# Patient Record
Sex: Male | Born: 1980 | Race: White | Hispanic: No | Marital: Single | Smoking: Former smoker
Health system: Southern US, Community
[De-identification: ages and names within clinical notes are randomized; demographics above are authoritative.]

## PROBLEM LIST (undated history)

## (undated) DIAGNOSIS — I10 Essential (primary) hypertension: Secondary | ICD-10-CM

---

## 2002-01-27 ENCOUNTER — Encounter: Payer: Self-pay | Admitting: Emergency Medicine

## 2002-01-27 ENCOUNTER — Emergency Department (HOSPITAL_COMMUNITY): Admission: EM | Admit: 2002-01-27 | Discharge: 2002-01-27 | Payer: Self-pay | Admitting: Emergency Medicine

## 2008-06-20 ENCOUNTER — Encounter: Admission: RE | Admit: 2008-06-20 | Discharge: 2008-06-20 | Payer: Self-pay | Admitting: Family Medicine

## 2009-04-22 ENCOUNTER — Encounter
Admission: RE | Admit: 2009-04-22 | Discharge: 2009-07-21 | Payer: Self-pay | Admitting: Physical Medicine & Rehabilitation

## 2009-04-28 ENCOUNTER — Ambulatory Visit: Payer: Self-pay | Admitting: Physical Medicine & Rehabilitation

## 2009-05-22 ENCOUNTER — Ambulatory Visit: Payer: Self-pay | Admitting: Physical Medicine & Rehabilitation

## 2009-05-29 ENCOUNTER — Ambulatory Visit: Payer: Self-pay | Admitting: Physical Medicine & Rehabilitation

## 2009-07-02 ENCOUNTER — Ambulatory Visit (HOSPITAL_COMMUNITY)
Admission: RE | Admit: 2009-07-02 | Discharge: 2009-07-02 | Payer: Self-pay | Admitting: Physical Medicine & Rehabilitation

## 2009-07-03 ENCOUNTER — Ambulatory Visit: Payer: Self-pay | Admitting: Physical Medicine & Rehabilitation

## 2009-07-29 ENCOUNTER — Encounter: Admission: RE | Admit: 2009-07-29 | Discharge: 2009-07-29 | Payer: Self-pay | Admitting: Podiatry

## 2010-06-29 ENCOUNTER — Inpatient Hospital Stay (INDEPENDENT_AMBULATORY_CARE_PROVIDER_SITE_OTHER)
Admission: RE | Admit: 2010-06-29 | Discharge: 2010-06-29 | Disposition: A | Discharge status: Home / Self Care | Payer: PRIVATE HEALTH INSURANCE | Source: Ambulatory Visit | Attending: Emergency Medicine | Admitting: Emergency Medicine

## 2010-06-29 DIAGNOSIS — R5381 Other malaise: Secondary | ICD-10-CM

## 2010-06-29 DIAGNOSIS — R5383 Other fatigue: Secondary | ICD-10-CM

## 2010-06-29 DIAGNOSIS — B9789 Other viral agents as the cause of diseases classified elsewhere: Secondary | ICD-10-CM

## 2010-06-29 LAB — CBC
HCT: 48.1 % (ref 39.0–52.0)
Hemoglobin: 17.6 g/dL — ABNORMAL HIGH (ref 13.0–17.0)
MCH: 32.5 pg (ref 26.0–34.0)
MCHC: 36.6 g/dL — ABNORMAL HIGH (ref 30.0–36.0)

## 2010-06-29 LAB — POCT I-STAT, CHEM 8
BUN: 8 mg/dL (ref 6–23)
Chloride: 102 mEq/L (ref 96–112)
Creatinine, Ser: 1 mg/dL (ref 0.4–1.5)
Hemoglobin: 18 g/dL — ABNORMAL HIGH (ref 13.0–17.0)
Potassium: 3.9 mEq/L (ref 3.5–5.1)
Sodium: 140 mEq/L (ref 135–145)
TCO2: 28 mmol/L (ref 0–100)

## 2010-06-29 LAB — DIFFERENTIAL
Basophils Relative: 0 % (ref 0–1)
Monocytes Absolute: 0.7 10*3/uL (ref 0.1–1.0)
Monocytes Relative: 6 % (ref 3–12)
Neutro Abs: 6.8 10*3/uL (ref 1.7–7.7)

## 2010-06-30 LAB — POCT URINALYSIS DIP (DEVICE)
Hgb urine dipstick: NEGATIVE
Protein, ur: NEGATIVE mg/dL
Specific Gravity, Urine: 1.02 (ref 1.005–1.030)
Urobilinogen, UA: 1 mg/dL (ref 0.0–1.0)

## 2010-10-14 ENCOUNTER — Emergency Department (HOSPITAL_COMMUNITY): Payer: Worker's Compensation

## 2010-10-14 ENCOUNTER — Emergency Department (HOSPITAL_COMMUNITY)
Admission: EM | Admit: 2010-10-14 | Discharge: 2010-10-14 | Disposition: A | Discharge status: Home / Self Care | Payer: Worker's Compensation | Attending: Emergency Medicine | Admitting: Emergency Medicine

## 2010-10-14 DIAGNOSIS — R112 Nausea with vomiting, unspecified: Secondary | ICD-10-CM | POA: Insufficient documentation

## 2010-10-14 DIAGNOSIS — I10 Essential (primary) hypertension: Secondary | ICD-10-CM | POA: Insufficient documentation

## 2010-10-14 DIAGNOSIS — R059 Cough, unspecified: Secondary | ICD-10-CM | POA: Insufficient documentation

## 2010-10-14 DIAGNOSIS — T542X1A Toxic effect of corrosive acids and acid-like substances, accidental (unintentional), initial encounter: Secondary | ICD-10-CM | POA: Insufficient documentation

## 2010-10-14 DIAGNOSIS — R0609 Other forms of dyspnea: Secondary | ICD-10-CM | POA: Insufficient documentation

## 2010-10-14 DIAGNOSIS — T594X4A Toxic effect of chlorine gas, undetermined, initial encounter: Secondary | ICD-10-CM | POA: Insufficient documentation

## 2010-10-14 DIAGNOSIS — R0602 Shortness of breath: Secondary | ICD-10-CM | POA: Insufficient documentation

## 2010-10-14 DIAGNOSIS — T5991XA Toxic effect of unspecified gases, fumes and vapors, accidental (unintentional), initial encounter: Secondary | ICD-10-CM | POA: Insufficient documentation

## 2010-10-14 DIAGNOSIS — R0989 Other specified symptoms and signs involving the circulatory and respiratory systems: Secondary | ICD-10-CM | POA: Insufficient documentation

## 2010-10-14 DIAGNOSIS — R05 Cough: Secondary | ICD-10-CM | POA: Insufficient documentation

## 2012-09-08 ENCOUNTER — Other Ambulatory Visit (HOSPITAL_COMMUNITY): Payer: Self-pay | Admitting: Internal Medicine

## 2012-09-08 DIAGNOSIS — M539 Dorsopathy, unspecified: Secondary | ICD-10-CM

## 2012-09-14 ENCOUNTER — Ambulatory Visit (HOSPITAL_COMMUNITY)
Admission: RE | Admit: 2012-09-14 | Discharge: 2012-09-14 | Disposition: A | Discharge status: Home / Self Care | Payer: 59 | Source: Ambulatory Visit | Attending: Internal Medicine | Admitting: Internal Medicine

## 2012-09-14 DIAGNOSIS — M539 Dorsopathy, unspecified: Secondary | ICD-10-CM

## 2012-09-14 DIAGNOSIS — M542 Cervicalgia: Secondary | ICD-10-CM | POA: Insufficient documentation

## 2012-09-14 DIAGNOSIS — R209 Unspecified disturbances of skin sensation: Secondary | ICD-10-CM | POA: Insufficient documentation

## 2012-09-14 DIAGNOSIS — M545 Low back pain, unspecified: Secondary | ICD-10-CM | POA: Insufficient documentation

## 2013-05-21 ENCOUNTER — Other Ambulatory Visit (HOSPITAL_COMMUNITY): Payer: Self-pay | Admitting: Internal Medicine

## 2013-05-21 DIAGNOSIS — K769 Liver disease, unspecified: Secondary | ICD-10-CM

## 2013-05-25 ENCOUNTER — Ambulatory Visit (HOSPITAL_COMMUNITY)
Admission: RE | Admit: 2013-05-25 | Discharge: 2013-05-25 | Disposition: A | Discharge status: Home / Self Care | Payer: 59 | Source: Ambulatory Visit | Attending: Internal Medicine | Admitting: Internal Medicine

## 2013-05-25 DIAGNOSIS — K769 Liver disease, unspecified: Secondary | ICD-10-CM

## 2013-05-25 DIAGNOSIS — K7689 Other specified diseases of liver: Secondary | ICD-10-CM | POA: Insufficient documentation

## 2013-09-06 ENCOUNTER — Other Ambulatory Visit (HOSPITAL_COMMUNITY): Payer: Self-pay | Admitting: Internal Medicine

## 2013-09-06 DIAGNOSIS — R109 Unspecified abdominal pain: Secondary | ICD-10-CM

## 2013-09-10 ENCOUNTER — Ambulatory Visit (HOSPITAL_COMMUNITY)
Admission: RE | Admit: 2013-09-10 | Discharge: 2013-09-10 | Disposition: A | Discharge status: Home / Self Care | Payer: 59 | Source: Ambulatory Visit | Attending: Internal Medicine | Admitting: Internal Medicine

## 2013-09-10 DIAGNOSIS — K7689 Other specified diseases of liver: Secondary | ICD-10-CM | POA: Insufficient documentation

## 2013-09-10 DIAGNOSIS — K5730 Diverticulosis of large intestine without perforation or abscess without bleeding: Secondary | ICD-10-CM | POA: Diagnosis not present

## 2013-09-10 DIAGNOSIS — R109 Unspecified abdominal pain: Secondary | ICD-10-CM | POA: Insufficient documentation

## 2013-09-10 MED ORDER — IOHEXOL 300 MG/ML  SOLN
100.0000 mL | Freq: Once | INTRAMUSCULAR | Status: AC | PRN
  Administered 2013-09-10: 100 mL via INTRAVENOUS

## 2013-09-10 MED ORDER — SODIUM CHLORIDE 0.9 % IJ SOLN
INTRAMUSCULAR | Status: AC
  Filled 2013-09-10: qty 45

## 2014-06-13 ENCOUNTER — Ambulatory Visit (INDEPENDENT_AMBULATORY_CARE_PROVIDER_SITE_OTHER): Payer: 59

## 2014-06-13 ENCOUNTER — Ambulatory Visit (INDEPENDENT_AMBULATORY_CARE_PROVIDER_SITE_OTHER): Payer: 59 | Admitting: Podiatry

## 2014-06-13 ENCOUNTER — Encounter: Payer: Self-pay | Admitting: Podiatry

## 2014-06-13 VITALS — BP 153/69 | HR 89 | Resp 12

## 2014-06-13 DIAGNOSIS — M722 Plantar fascial fibromatosis: Secondary | ICD-10-CM

## 2014-06-13 MED ORDER — TRIAMCINOLONE ACETONIDE 10 MG/ML IJ SUSP
10.0000 mg | Freq: Once | INTRAMUSCULAR | Status: AC
  Administered 2014-06-13: 10 mg

## 2014-06-13 MED ORDER — DICLOFENAC SODIUM 75 MG PO TBEC: 75.0000 mg | DELAYED_RELEASE_TABLET | Freq: Two times a day (BID) | ORAL | Status: DC

## 2014-06-13 NOTE — Patient Instructions (Signed)

## 2014-06-13 NOTE — Progress Notes (Signed)
   Subjective:    Patient ID: Robert BertholdJoshua David Dowell, male    DOB: Jul 14, 1980, 34 y.o.   MRN: [id number]  HPI PT STATED LT BOTTOM OF THE FOOT BEEN HAVING PAIN FOR 5 YEARS. THE FOOT IS GETTING WORSE ESPECIALLY WHEN WALKING. TRIED PAIN MEDICATION AND FOOT BRACE BUT NO HELP.   Review of Systems  HENT: Positive for sinus pressure.        Objective:   Physical Exam        Assessment & Plan:

## 2014-06-14 ENCOUNTER — Ambulatory Visit: Payer: Self-pay | Admitting: Podiatry

## 2014-06-14 NOTE — Progress Notes (Signed)
Subjective:     Patient ID: Robert Thornton, male   DOB: 1980/12/29, 34 y.o.   MRN: [id number]  HPI patient states she's had a long-term history of pain in his left heel but it's gotten worse recently. For 5 years off and on it's been hurting for the last few months and it's been increasingly tender   Review of Systems  All other systems reviewed and are negative.      Objective:   Physical Exam  Constitutional: He is oriented to person, place, and time.  Cardiovascular: Intact distal pulses.   Musculoskeletal: Normal range of motion.  Neurological: He is oriented to person, place, and time.  Skin: Skin is warm.  Nursing note and vitals reviewed.  neurovascular status intact muscle strength adequate with range of motion subtalar midtarsal joint within normal limits. Patient's found to have good digital perfusion is well oriented 3 and upon palpation has quite a bit of discomfort in the left plantar fascia at the insertion of the tendon into the calcaneus     Assessment:     Plantar fasciitis left long-term nature with worsening of condition with foot mechanics as part of the problem due to young age and moderate flatfoot deformity    Plan:     H&P and x-rays performed. Injected the left plantar fascia 3 mg Kenalog 5 mg Xylocaine and applied fascial brace with instructions on usage. Placed on diclofenac and reappoint to reevaluate again in the next 2 weeks may require more aggressive treatment and will require orthotics

## 2014-06-19 ENCOUNTER — Ambulatory Visit (INDEPENDENT_AMBULATORY_CARE_PROVIDER_SITE_OTHER): Payer: 59 | Admitting: Podiatry

## 2014-06-19 ENCOUNTER — Encounter: Payer: Self-pay | Admitting: Podiatry

## 2014-06-19 VITALS — BP 124/91 | HR 80 | Resp 15

## 2014-06-19 DIAGNOSIS — M722 Plantar fascial fibromatosis: Secondary | ICD-10-CM | POA: Diagnosis not present

## 2014-06-19 DIAGNOSIS — M779 Enthesopathy, unspecified: Secondary | ICD-10-CM

## 2014-06-19 MED ORDER — TRIAMCINOLONE ACETONIDE 10 MG/ML IJ SUSP
10.0000 mg | Freq: Once | INTRAMUSCULAR | Status: AC
  Administered 2014-06-19: 10 mg

## 2014-06-19 MED ORDER — HYDROCODONE-ACETAMINOPHEN 10-325 MG PO TABS: 1.0000 | ORAL_TABLET | Freq: Three times a day (TID) | ORAL | Status: DC | PRN

## 2014-06-19 NOTE — Progress Notes (Signed)
Subjective:     Patient ID: Robert Thornton, male   DOB: November 15, 1980, 34 y.o.   MRN: [id number]  HPI patient states my heel continues to kill me and also I'm getting a lot of pain in my ankle probably from walking differently and I'm really getting tired of this as it's been going on for a number of years and has worsened recently patient states that he's tried different treatment options without relief of symptoms   Review of Systems     Objective:   Physical Exam Neurovascular status intact muscle strength is adequate with continued discomfort in the plantar aspect of the left heel at the insertional point of the tendon into the calcaneus with fluid buildup noted around the medial band at the insertion of the tendon into the calcaneus and also quite a bit of discomfort noted in the sinus tarsi left    Assessment:     Chronic discomfort secondary to foot structure and plantar fascial-like symptomatology occurring left over right    Plan:     Reviewed conditions and discussed that ultimately this is can require surgery. At this time we're going to try 1 more inserted care before we consider surgery but he does understand he's given need to have this done eventually and he would like to be able to wait several months due to people being out of work. I injected the plantar fascia 3 Milligan Kenalog in the sinus tarsi 3 mg Kenalog 5 mill grams Xylocaine and instructed on reduced activity and reappoint to recheck in 3 weeks and we did go ahead and scanned for orthotics for this patient at the current time

## 2014-06-20 ENCOUNTER — Ambulatory Visit: Payer: 59 | Admitting: Podiatry

## 2014-07-02 ENCOUNTER — Telehealth: Payer: Self-pay | Admitting: *Deleted

## 2014-07-02 MED ORDER — HYDROCODONE-ACETAMINOPHEN 10-325 MG PO TABS: 1.0000 | ORAL_TABLET | Freq: Three times a day (TID) | ORAL | Status: DC | PRN

## 2014-07-02 NOTE — Telephone Encounter (Addendum)
Pt request refill of Vicodin, was instructed to take at night initially, but asked if could take during the day.  Dr. Charlsie Merles stated if the is no problem while taking the Vicodin during the day he can do so.  Pt states he is able to take the Vicodin during the day without problems and would like a refill.  Dr. Charlsie Merles ordered refill as previously.

## 2014-07-02 NOTE — Telephone Encounter (Signed)
fine

## 2014-07-03 ENCOUNTER — Ambulatory Visit: Payer: 59 | Admitting: *Deleted

## 2014-07-03 DIAGNOSIS — M722 Plantar fascial fibromatosis: Secondary | ICD-10-CM

## 2014-07-03 NOTE — Patient Instructions (Signed)

## 2014-07-03 NOTE — Progress Notes (Signed)
Patient ID: Robert Thornton, male   DOB: [birth year], 34 y.o.   MRN: [id number] PICKING UP INSERTS

## 2014-07-05 ENCOUNTER — Other Ambulatory Visit: Payer: 59

## 2014-07-16 ENCOUNTER — Encounter: Payer: Self-pay | Admitting: Podiatry

## 2014-07-16 ENCOUNTER — Ambulatory Visit (INDEPENDENT_AMBULATORY_CARE_PROVIDER_SITE_OTHER): Payer: 59 | Admitting: Podiatry

## 2014-07-16 VITALS — BP 125/81 | HR 74 | Resp 15

## 2014-07-16 DIAGNOSIS — M722 Plantar fascial fibromatosis: Secondary | ICD-10-CM | POA: Diagnosis not present

## 2014-07-16 MED ORDER — HYDROCODONE-ACETAMINOPHEN 10-325 MG PO TABS
1.0000 | ORAL_TABLET | Freq: Three times a day (TID) | ORAL | Status: DC | PRN
Start: 2014-07-16 — End: 2014-08-09

## 2014-07-16 NOTE — Progress Notes (Signed)
Subjective:     Patient ID: Robert Thornton, male   DOB: [birth year], 34 y.o.   MRN: [id number]  HPI patient states that my heel has been killing me and it did not respond to the medication and I am just wanted to have it fixed and the ulna with can control my pain is with medication   Review of Systems     Objective:   Physical Exam Neurovascular status intact muscle strength adequate with discomfort of an exquisite nature left plantar heel medial band at the insertion of the tendon into the calcaneus    Assessment:     Plantar fasciitis left inflammation and fluid around the medial band    Plan:     This is been present for over 2 years and is failed to respond to numerous conservative cares and at this point I have recommended endoscopic release of the medial band. Patient wants surgery and I allowed injury consent form going over alternative treatments and also complications as listed. He understands no guarantee as to results of this procedure and recovery. Can take around 6 months and at this time he signed consent form after extensive review and is scheduled for outpatient surgery. I dispensed air fracture walker with instructions on usage at this time. Due to the pain continues to experience I did write him for another prescription of Vicodin that he can take up to 2 or 3 a day as needed for significant heel pain

## 2014-07-17 ENCOUNTER — Ambulatory Visit: Payer: 59 | Admitting: Podiatry

## 2014-07-23 ENCOUNTER — Telehealth: Payer: Self-pay | Admitting: *Deleted

## 2014-07-23 NOTE — Telephone Encounter (Addendum)
Pt states he is scheduled for surgery 09/05/2014, the Voltaren and Hydrocodone are not helping.  Dr. Charlsie Merlesegal states have pt come in for possibly an injection.

## 2014-07-24 ENCOUNTER — Telehealth: Payer: Self-pay | Admitting: *Deleted

## 2014-07-24 ENCOUNTER — Ambulatory Visit (INDEPENDENT_AMBULATORY_CARE_PROVIDER_SITE_OTHER): Payer: 59 | Admitting: Podiatry

## 2014-07-24 ENCOUNTER — Encounter: Payer: Self-pay | Admitting: Podiatry

## 2014-07-24 VITALS — BP 140/90 | HR 77 | Resp 15

## 2014-07-24 DIAGNOSIS — M722 Plantar fascial fibromatosis: Secondary | ICD-10-CM | POA: Diagnosis not present

## 2014-07-24 MED ORDER — OXYCODONE-ACETAMINOPHEN 10-325 MG PO TABS
1.0000 | ORAL_TABLET | Freq: Three times a day (TID) | ORAL | Status: DC | PRN
Start: 2014-07-24 — End: 2014-07-30

## 2014-07-24 MED ORDER — OXYCODONE-ACETAMINOPHEN 10-325 MG PO TABS
1.0000 | ORAL_TABLET | ORAL | Status: DC | PRN
Start: 2014-07-24 — End: 2014-07-24

## 2014-07-24 NOTE — Telephone Encounter (Signed)
I called and rescheduled patient's surgery from 09/03/2014 to 08/20/2014 per Dr. Charlsie Merlesegal.

## 2014-07-25 ENCOUNTER — Telehealth: Payer: Self-pay | Admitting: *Deleted

## 2014-07-25 NOTE — Telephone Encounter (Signed)
"  I was there yesterday.  Dr. Charlsie Merlesegal was going to move my surgery up.  I didn't get a clear date of when he was going to move it up."  He moved you up to 08/20/2014.  "When will I hear from the surgery center?"  They will call you a day or 2 before and give you the exact arrival time.  "Okay, that's all I needed to know.  Now I can let my HR know and fill out my papers for disability."

## 2014-07-26 NOTE — Progress Notes (Signed)
Subjective:     Patient ID: Robert Thornton, male   DOB: 08-Jul-1980, 34 y.o.   MRN: [id number]  HPI patient presents stating that his heel is still hurting him quite a bit and the pain medicine does not solve the pain at nighttime   Review of Systems     Objective:   Physical Exam Neurovascular status found to be intact muscle strength was adequate and range of motion within normal limits. I did note there to be continued severe discomfort when I palpated the plantar medial aspect of the left heel with inflammation and fluid buildup. He did not have any diminishment in sharp [place] vibratory and his DTR reflexes were intact bilateral    Assessment:     I discussed with Robert Thornton the type of pain he is having and while a significant portion of this appears to be plantar fascial and orientation I cannot rule out that there may not be issues with his back. Patient states that he has seen orthopedic doctor within the last year who did MRIs and did not note any significant pathology and he has had no burning in his upper leg or indications of sciatica or nerve entrapment-like problems except for this continuous pain in his heel which may or may not have some origin in the back or may be completely related to inflamed plantar fascial    Plan:     I reviewed all this with him and at this time we'll remove his surgery and I explained there is no guarantees that this will solve his problem and that it is possible that there may be an issue with his back that may need to be explored evaluated with possible treatment. He understands this completely and wants to first pursue the plantar heel and see the relief he obtains and decide whether or not any other further examinations evaluations will be necessary. I did write him for Percocet for the short-term and his surgery is moved August 2

## 2014-07-30 ENCOUNTER — Telehealth: Payer: Self-pay | Admitting: *Deleted

## 2014-07-30 MED ORDER — OXYCODONE-ACETAMINOPHEN 10-325 MG PO TABS: 1.0000 | ORAL_TABLET | Freq: Three times a day (TID) | ORAL | Status: DC | PRN

## 2014-07-30 NOTE — Telephone Encounter (Addendum)
Pt states he needs a refill of the Percocet.  Dr. Everlena Cooperegal okayed as previously filled.  I refilled one more time, and also 3 days early.

## 2014-07-30 NOTE — Telephone Encounter (Signed)
I wrote him for percoset last visit. He is not asking for another prescription now is he?

## 2014-08-01 ENCOUNTER — Telehealth: Payer: Self-pay | Admitting: *Deleted

## 2014-08-01 ENCOUNTER — Encounter: Payer: Self-pay | Admitting: *Deleted

## 2014-08-01 NOTE — Telephone Encounter (Signed)
Authorization for 08/20/14 surgery was faxed to surgical center.  Authorization number is [id number].

## 2014-08-01 NOTE — Progress Notes (Signed)
Patient is [facility] 08/20/2014.  I had to get [facility] from [facility].  Authorization number is [id number].  I faxed authorization to Aram [facility]Cynthia at [facility].

## 2014-08-09 ENCOUNTER — Other Ambulatory Visit: Payer: Self-pay

## 2014-08-09 MED ORDER — HYDROCODONE-ACETAMINOPHEN 10-325 MG PO TABS: 1.0000 | ORAL_TABLET | Freq: Three times a day (TID) | ORAL | Status: DC | PRN

## 2014-08-20 ENCOUNTER — Encounter: Payer: Self-pay | Admitting: *Deleted

## 2014-08-20 DIAGNOSIS — M722 Plantar fascial fibromatosis: Secondary | ICD-10-CM | POA: Diagnosis not present

## 2014-08-21 ENCOUNTER — Telehealth: Payer: Self-pay | Admitting: *Deleted

## 2014-08-21 NOTE — Telephone Encounter (Signed)
"  I'm calling to verify that patient had surgery.  What date did he have surgery?"  He had it done on 08/20/2014.  "What is the procedure code or diagnosis code?"  The procedure code is [id number].  "The diagnosis is Plantar Fasciitis?"  Yes, it is Plantar Fasciitis.  "Thank you so much you've been a big help."

## 2014-08-23 ENCOUNTER — Telehealth: Payer: Self-pay | Admitting: *Deleted

## 2014-08-23 NOTE — Telephone Encounter (Signed)
Pt asked if he was to keep the post-op dressing in place, and could he shower with his foot in a plastic bag until seen by Dr. Charlsie Merles.  I told him to leave the dressing in place and cover with large plastic bag to shower.  Pt states he's doing well.

## 2014-08-26 ENCOUNTER — Other Ambulatory Visit: Payer: Self-pay

## 2014-08-27 ENCOUNTER — Telehealth: Payer: Self-pay | Admitting: *Deleted

## 2014-08-27 NOTE — Telephone Encounter (Signed)
He should be coming in for his postop tomorrow. Don't want to refill until I see him

## 2014-08-27 NOTE — Progress Notes (Unsigned)
DOS 08/20/2014 Endoscopic release medial band left heel.

## 2014-08-27 NOTE — Telephone Encounter (Signed)
Pt request refill of Percocet

## 2014-08-29 ENCOUNTER — Encounter: Payer: Self-pay | Admitting: Podiatry

## 2014-08-29 ENCOUNTER — Ambulatory Visit (INDEPENDENT_AMBULATORY_CARE_PROVIDER_SITE_OTHER): Payer: 59 | Admitting: Podiatry

## 2014-08-29 VITALS — BP 121/77 | HR 79 | Resp 18

## 2014-08-29 DIAGNOSIS — M722 Plantar fascial fibromatosis: Secondary | ICD-10-CM | POA: Diagnosis not present

## 2014-08-29 MED ORDER — HYDROCODONE-ACETAMINOPHEN 10-325 MG PO TABS: 1.0000 | ORAL_TABLET | Freq: Three times a day (TID) | ORAL | Status: DC | PRN

## 2014-08-29 NOTE — Progress Notes (Signed)
Subjective:     Patient ID: Robert Thornton, male   DOB: 01-Apr-1980, 34 y.o.   MRN: [id number]  HPI patient presents stating my heel seems to be doing well but I still have quite a bit of discomfort in the evening   Review of Systems     Objective:   Physical Exam Neurovascular status intact muscle strength adequate with patient having well coapted incision sites medial lateral aspect of the heel left with negative Homans sign noted. Minimal discomfort when I pressed plantar    Assessment:     Doing well after endoscopic plantar fasciotomy left medial band    Plan:     Advised on continued immobilization with boot for surgical shoe and I reapplied sterile dressing. I did write him for Vicodin 10/21/2023 to use is oriented to try to wean him off pain medicine and I explained him the importance and also to take oral anti-inflammatory. Reappoint 2 weeks for suture removal or earlier if necessary

## 2014-09-03 ENCOUNTER — Telehealth: Payer: Self-pay | Admitting: *Deleted

## 2014-09-03 NOTE — Telephone Encounter (Signed)
Pt asked what to do for night time pain,and how much to wear the surgical sandal and how long to be up on the foot, and could he swim in a private pool.  I told pt to take Ibuprofen if he tolerated it at night and to Ice for comfort, wear the sandal at all times while up and not to be up on the foot more than 15-20 min/hour, and he could swim in a private pool, but wait until no scabbing before ocean or lake swimming.  Pt states understanding.

## 2014-09-09 ENCOUNTER — Telehealth: Payer: Self-pay | Admitting: *Deleted

## 2014-09-09 MED ORDER — HYDROCODONE-ACETAMINOPHEN 10-325 MG PO TABS: 1.0000 | ORAL_TABLET | Freq: Two times a day (BID) | ORAL | Status: DC

## 2014-09-09 NOTE — Telephone Encounter (Signed)
Pt states he is still having a little pain and would like refill of the Hydrocodone.  Dr. Charlsie Merles states can begin to taper down the pain medication as the pt gets further into the post-op period.  Dr. Charlsie Merles orders Hydrocodone 10/325mg  #20 1 tablet bid.  Pt is informed and will pick the rx up in the [place] office.

## 2014-09-16 ENCOUNTER — Telehealth: Payer: Self-pay | Admitting: *Deleted

## 2014-09-16 NOTE — Telephone Encounter (Addendum)
Pt states he still feel like there's some swelling and he still has some pain, this that normal.  I told pt it would be close to 6-9 months before he felt normal and without swelling to some degree.  I encouraged pt to rest ice, and take OTC Ibuprofen if he tolerated it and I would see if Dr. Charlsie Merles wanted him to take a rx antiinflammatory, and call tomorrow.  Pt states understanding.  Pt states he knew it took time to get an answer to his message about the antiinflammatory medication and he wanted [place] to know his pain medication runs out today and he would like a refill.  Dr. Charlsie Merles ordered Tramadol  #90 begin 1 tablet every day, and increase as tolerated to 3 times a day.  Left a message with instructions concerning the Tramadol, and to pick the prescription up in the [place] office.  Pt states it is okay for his father to pick up the Tramadol rx, call if this is a problem.

## 2014-09-18 MED ORDER — TRAMADOL HCL 50 MG PO TABS: 50.0000 mg | ORAL_TABLET | Freq: Three times a day (TID) | ORAL | Status: DC | PRN

## 2014-09-26 ENCOUNTER — Encounter: Payer: Self-pay | Admitting: Podiatry

## 2014-09-26 ENCOUNTER — Ambulatory Visit (INDEPENDENT_AMBULATORY_CARE_PROVIDER_SITE_OTHER): Payer: 59 | Admitting: Podiatry

## 2014-09-26 VITALS — BP 128/88 | HR 80 | Resp 16

## 2014-09-26 DIAGNOSIS — M722 Plantar fascial fibromatosis: Secondary | ICD-10-CM | POA: Diagnosis not present

## 2014-09-26 NOTE — Progress Notes (Signed)
Subjective:     Patient ID: Robert Thornton, male   DOB: 1980/11/24, 34 y.o.   MRN: [id number]  HPI patient presents stating my heel is doing quite a bit better   Review of Systems     Objective:   Physical Exam Neurovascular status intact muscle strength adequate negative Homans sign noted with significant diminishment of discomfort in the plantar aspect of the left heel at the insertional point tendon into calcaneus with wound edges well coapted    Assessment:     Doing well post endoscopic release medial fascial band left    Plan:     Advised on physical therapy anti-inflammatory's and gradual return soft shoe with hopeful return to work in the next 2 weeks. If symptoms were to get worse let us know and I dispensed anklet today

## 2014-09-30 ENCOUNTER — Ambulatory Visit (HOSPITAL_COMMUNITY)
Admission: RE | Admit: 2014-09-30 | Discharge: 2014-09-30 | Disposition: A | Discharge status: Home / Self Care | Payer: 59 | Source: Ambulatory Visit | Attending: Internal Medicine | Admitting: Internal Medicine

## 2014-09-30 ENCOUNTER — Other Ambulatory Visit (HOSPITAL_COMMUNITY): Payer: Self-pay | Admitting: Internal Medicine

## 2014-09-30 DIAGNOSIS — R600 Localized edema: Secondary | ICD-10-CM

## 2014-09-30 DIAGNOSIS — M79661 Pain in right lower leg: Secondary | ICD-10-CM | POA: Diagnosis present

## 2014-10-07 ENCOUNTER — Telehealth: Payer: Self-pay | Admitting: *Deleted

## 2014-10-07 NOTE — Telephone Encounter (Signed)
Pt presents to pick up copy of release to work. Printed and given to pt.

## 2015-05-08 ENCOUNTER — Emergency Department (HOSPITAL_COMMUNITY)
Admission: EM | Admit: 2015-05-08 | Discharge: 2015-05-08 | Disposition: A | Discharge status: Home / Self Care | Payer: 59 | Attending: Emergency Medicine | Admitting: Emergency Medicine

## 2015-05-08 ENCOUNTER — Emergency Department (HOSPITAL_COMMUNITY): Payer: 59

## 2015-05-08 ENCOUNTER — Encounter (HOSPITAL_COMMUNITY): Payer: Self-pay | Admitting: Emergency Medicine

## 2015-05-08 DIAGNOSIS — R1032 Left lower quadrant pain: Secondary | ICD-10-CM | POA: Diagnosis not present

## 2015-05-08 DIAGNOSIS — K5900 Constipation, unspecified: Secondary | ICD-10-CM | POA: Insufficient documentation

## 2015-05-08 DIAGNOSIS — Z87891 Personal history of nicotine dependence: Secondary | ICD-10-CM | POA: Insufficient documentation

## 2015-05-08 DIAGNOSIS — Z79899 Other long term (current) drug therapy: Secondary | ICD-10-CM | POA: Insufficient documentation

## 2015-05-08 DIAGNOSIS — I10 Essential (primary) hypertension: Secondary | ICD-10-CM | POA: Diagnosis not present

## 2015-05-08 DIAGNOSIS — R109 Unspecified abdominal pain: Secondary | ICD-10-CM

## 2015-05-08 HISTORY — DX: Essential (primary) hypertension: I10

## 2015-05-08 LAB — COMPREHENSIVE METABOLIC PANEL
ALT: 68 U/L — ABNORMAL HIGH (ref 17–63)
ANION GAP: 8 (ref 5–15)
AST: 37 U/L (ref 15–41)
Albumin: 3.8 g/dL (ref 3.5–5.0)
Alkaline Phosphatase: 52 U/L (ref 38–126)
BILIRUBIN TOTAL: 0.9 mg/dL (ref 0.3–1.2)
BUN: 6 mg/dL (ref 6–20)
CO2: 25 mmol/L (ref 22–32)
Calcium: 8.6 mg/dL — ABNORMAL LOW (ref 8.9–10.3)
Chloride: 104 mmol/L (ref 101–111)
Creatinine, Ser: 0.93 mg/dL (ref 0.61–1.24)
Glucose, Bld: 87 mg/dL (ref 65–99)
POTASSIUM: 4.4 mmol/L (ref 3.5–5.1)
Sodium: 137 mmol/L (ref 135–145)
TOTAL PROTEIN: 6.5 g/dL (ref 6.5–8.1)

## 2015-05-08 LAB — CBC
HEMATOCRIT: 47.8 % (ref 39.0–52.0)
HEMOGLOBIN: 16.3 g/dL (ref 13.0–17.0)
MCH: 31.2 pg (ref 26.0–34.0)
MCHC: 34.1 g/dL (ref 30.0–36.0)
MCV: 91.4 fL (ref 78.0–100.0)
Platelets: 197 10*3/uL (ref 150–400)
RBC: 5.23 MIL/uL (ref 4.22–5.81)
RDW: 12.5 % (ref 11.5–15.5)
WBC: 8.6 10*3/uL (ref 4.0–10.5)

## 2015-05-08 LAB — LIPASE, BLOOD: LIPASE: 23 U/L (ref 11–51)

## 2015-05-08 LAB — URINALYSIS, ROUTINE W REFLEX MICROSCOPIC
Bilirubin Urine: NEGATIVE
Glucose, UA: NEGATIVE mg/dL
Hgb urine dipstick: NEGATIVE
KETONES UR: NEGATIVE mg/dL
LEUKOCYTES UA: NEGATIVE
NITRITE: NEGATIVE
PH: 5.5 (ref 5.0–8.0)
PROTEIN: NEGATIVE mg/dL
Specific Gravity, Urine: 1.014 (ref 1.005–1.030)

## 2015-05-08 LAB — I-STAT TROPONIN, ED: Troponin i, poc: 0 ng/mL (ref 0.00–0.08)

## 2015-05-08 MED ORDER — SODIUM CHLORIDE 0.9 % IV SOLN
INTRAVENOUS | Status: DC
  Administered 2015-05-08: 12:00:00 via INTRAVENOUS

## 2015-05-08 MED ORDER — IOPAMIDOL (ISOVUE-300) INJECTION 61%
INTRAVENOUS | Status: AC
  Administered 2015-05-08: 100 mL
  Filled 2015-05-08: qty 100

## 2015-05-08 MED ORDER — HYDROMORPHONE HCL 1 MG/ML IJ SOLN: 1.0000 mg | INTRAMUSCULAR | Status: DC | PRN

## 2015-05-08 MED ORDER — ONDANSETRON HCL 4 MG/2ML IJ SOLN
4.0000 mg | Freq: Once | INTRAMUSCULAR | Status: AC
  Administered 2015-05-08: 4 mg via INTRAVENOUS
  Filled 2015-05-08: qty 2

## 2015-05-08 MED ORDER — SODIUM CHLORIDE 0.9 % IV BOLUS (SEPSIS)
1000.0000 mL | Freq: Once | INTRAVENOUS | Status: AC
  Administered 2015-05-08: 1000 mL via INTRAVENOUS

## 2015-05-08 NOTE — ED Notes (Signed)
Pt is in stable condition upon d/c and ambulates from ED. 

## 2015-05-08 NOTE — ED Notes (Signed)
While NT was trying to draw blood, patient became hot and started sweating.   Patient states was nauseated.   Patient denied dizziness or lightheadedness.  "I am hot, it's hot in here".

## 2015-05-08 NOTE — ED Notes (Signed)
MD at bedside; urinal given.

## 2015-05-08 NOTE — ED Notes (Signed)
Patient transported to X-ray 

## 2015-05-08 NOTE — ED Notes (Signed)
Patient states abdominal pain, constipation x 3 weeks.   Patient states has had some back pain also.   Patient states he has been trying to medicate at home with prune juice, formula 1, enemas, stool softeners and no relief.

## 2015-05-08 NOTE — Discharge Instructions (Signed)

## 2015-05-08 NOTE — ED Provider Notes (Signed)
CSN: [id number]     Arrival date & time 05/08/15  0940 History   First MD Initiated Contact with Patient 05/08/15 1012     Chief Complaint  Patient presents with  . Abdominal Pain  . Constipation   Patient is a 35 y.o. male presenting with abdominal pain and constipation. The history is provided by the patient.  Abdominal Pain Pain location:  LLQ Pain quality: bloating and sharp   Pain radiation: lower back. Pain severity:  Severe Onset quality:  Gradual Duration:  4 weeks Timing:  Constant Progression:  Worsening Associated symptoms: constipation   Associated symptoms: no anorexia, no diarrhea, no fever and no vomiting  Cough: he has not had normal bowel movements normally although he is passing stools and he had some stool with enemas.   Constipation Associated symptoms: abdominal pain   Associated symptoms: no anorexia, no diarrhea, no fever and no vomiting    He has not seen anyone since it started.  While here in the ED he began to feel hot and lightheaded as if he was going to faint when his blood was drawn.  That is improving now. Past Medical History  Diagnosis Date  . Hypertension    History reviewed. No pertinent past surgical history. No family history on file. Social History  Substance Use Topics  . Smoking status: Former Games developer  . Smokeless tobacco: None  . Alcohol Use: No    Review of Systems  Constitutional: Negative for fever.  Respiratory: Cough: he has not had normal bowel movements normally although he is passing stools and he had some stool with enemas.   Gastrointestinal: Positive for abdominal pain and constipation. Negative for vomiting, diarrhea and anorexia.  All other systems reviewed and are negative.     Allergies  Sulfa antibiotics and Tramadol  Home Medications   Prior to Admission medications   Medication Sig Start Date End Date Taking? Authorizing Provider  ibuprofen (ADVIL,MOTRIN) 200 MG tablet Take 600 mg by mouth every 6 (six)  hours as needed for mild pain.    Yes Historical Provider, MD  ZUBSOLV 5.7-1.4 MG SUBL Take 1 tablet by mouth 2 (two) times daily. 04/21/15  Yes Historical Provider, MD   BP 113/80 mmHg  Pulse 76  Temp(Src) 97.9 F (36.6 C) (Oral)  Resp 17  Ht  (1.88 m)  Wt 104.327 kg  BMI 29.52 kg/m2  SpO2 95% Physical Exam  Constitutional: No distress.  HENT:  Head: Normocephalic and atraumatic.  Right Ear: External ear normal.  Left Ear: External ear normal.  Eyes: Conjunctivae are normal. Right eye exhibits no discharge. Left eye exhibits no discharge. No scleral icterus.  Neck: Neck supple. No tracheal deviation present.  Cardiovascular: Normal rate, regular rhythm and intact distal pulses.   Pulmonary/Chest: Effort normal and breath sounds normal. No stridor. No respiratory distress. He has no wheezes. He has no rales.  Abdominal: Soft. Bowel sounds are normal. He exhibits no distension and no mass. There is tenderness in the left lower quadrant. There is no rigidity, no rebound and no guarding. No hernia.  Genitourinary:  No rectal mass  Musculoskeletal: He exhibits no edema or tenderness.  Neurological: He is alert. He has normal strength. No cranial nerve deficit (no facial droop, extraocular movements intact, no slurred speech) or sensory deficit. He exhibits normal muscle tone. He displays no seizure activity. Coordination normal.  Skin: Skin is warm and dry. No rash noted. He is not diaphoretic.  Psychiatric: He has a normal  mood and affect.  Nursing note and vitals reviewed.   ED Course  Procedures (including critical care time) Labs Review Labs Reviewed  COMPREHENSIVE METABOLIC PANEL - Abnormal; Notable for the following:    Calcium 8.6 (*)    ALT 68 (*)    All other components within normal limits  LIPASE, BLOOD  CBC  URINALYSIS, ROUTINE W REFLEX MICROSCOPIC (NOT AT [facility])  I-STAT TROPOININ, ED    Imaging Review Ct Abdomen Pelvis W Contrast  05/08/2015  CLINICAL DATA:   Abdominal pain with change in bowel habits for 3 weeks. Constipation. EXAM: CT ABDOMEN AND PELVIS WITH CONTRAST TECHNIQUE: Multidetector CT imaging of the abdomen and pelvis was performed using the standard protocol following bolus administration of intravenous contrast. CONTRAST:  100cc ISOVUE-300 IOPAMIDOL (ISOVUE-300) INJECTION 61% COMPARISON:  09/10/2013 FINDINGS: Lower chest and abdominal wall: 4 mm nodule in the left lower lobe, considered incidental based on size and patient age. Hepatobiliary: Hepatic steatosis without focal lesion.No evidence of biliary obstruction or stone. Pancreas: Unremarkable. Spleen: Unremarkable. Adrenals/Urinary Tract: Negative adrenals. No hydronephrosis or stone. Unremarkable bladder. Reproductive:No pathologic findings. Stomach/Bowel:  No obstruction. No appendicitis. Vascular/Lymphatic: No acute vascular abnormality. Incidental low accessory right renal artery. No mass or adenopathy. Peritoneal: No ascites or pneumoperitoneum. Musculoskeletal: No acute abnormalities. IMPRESSION: 1. No acute finding or explanation for symptoms. No abnormal stool retention to correlate with constipation history. 2. Hepatic steatosis. Electronically Signed   By: Marnee SpringJonathon  Watts M.D.   On: 05/08/2015 13:51   Dg Abd Acute W/chest  05/08/2015  CLINICAL DATA:  Abdominal pain and constipation for 3 weeks, has been taking stool softeners and enemas EXAM: DG ABDOMEN ACUTE W/ 1V CHEST COMPARISON:  None. FINDINGS: Heart size and vascular pattern normal.  Lungs clear.  No free air. Air-fluid levels throughout the colon with some evidence of colon wall thickening. No abnormally dilated loops of bowel. No significant fecal retention. No abnormal opacities. IMPRESSION: Air-fluid levels throughout the large bowel likely the result of self medication for constipation. Currently no significant fecal retention. Suggestion of colon wall thickening. This could indicate colitis. Alternatively this may be the result  of under distention of the large bowel. Correlate clinically and if indicated consider CT scan. Electronically Signed   By: Esperanza Heiraymond  Rubner M.D.   On: 05/08/2015 10:56   I have personally reviewed and evaluated these images and lab results as part of my medical decision-making.   EKG Interpretation  Date/Time:  Thursday May 08 2015 10:10:39 EDT Ventricular Rate:  78 PR Interval:  133 QRS Duration: 94 QT Interval:  339 QTC Calculation: 386 R Axis:   91 Text Interpretation:  Normal sinus rhythm Probable left atrial enlargement Borderline right axis deviation ST elevation suggests acute pericarditis vs early repolarization No previous tracing Reconfirmed by Nochum Fenter  MD-J, Krysta Bloomfield ([id number]) on 05/08/2015 2:32:54 PM        MDM   Final diagnoses:  Abdominal pain, unspecified abdominal location    The patient presented to the emergency room with abdominal pain. While getting his blood drawn he had a vasovagal episode.  EKG is reassuring. Cardiac enzymes are normal.  Laboratory tests are otherwise unremarkable.  Because of the abnormal plain film findings a CT scan was done for further evaluation. No evidence of obstruction or significant constipation. The patient's symptoms could be related to his chronic opiate use.  At this time there does not appear to be any evidence of an acute emergency medical condition and the patient appears stable for discharge  with appropriate outpatient follow up.    Linwood Dibbles, MD 05/08/15 [phone]

## 2016-01-21 DIAGNOSIS — J0100 Acute maxillary sinusitis, unspecified: Secondary | ICD-10-CM | POA: Diagnosis not present

## 2016-01-21 DIAGNOSIS — G4733 Obstructive sleep apnea (adult) (pediatric): Secondary | ICD-10-CM | POA: Diagnosis not present

## 2016-01-21 DIAGNOSIS — J069 Acute upper respiratory infection, unspecified: Secondary | ICD-10-CM | POA: Diagnosis not present

## 2016-02-12 DIAGNOSIS — G4733 Obstructive sleep apnea (adult) (pediatric): Secondary | ICD-10-CM | POA: Diagnosis not present

## 2016-02-13 DIAGNOSIS — R070 Pain in throat: Secondary | ICD-10-CM | POA: Diagnosis not present

## 2016-02-19 ENCOUNTER — Ambulatory Visit (INDEPENDENT_AMBULATORY_CARE_PROVIDER_SITE_OTHER): Payer: 59 | Admitting: Otolaryngology

## 2016-02-19 DIAGNOSIS — J351 Hypertrophy of tonsils: Secondary | ICD-10-CM

## 2016-02-19 DIAGNOSIS — J3501 Chronic tonsillitis: Secondary | ICD-10-CM

## 2016-02-19 DIAGNOSIS — R070 Pain in throat: Secondary | ICD-10-CM | POA: Diagnosis not present

## 2016-02-21 DIAGNOSIS — G4733 Obstructive sleep apnea (adult) (pediatric): Secondary | ICD-10-CM | POA: Diagnosis not present

## 2016-03-11 ENCOUNTER — Ambulatory Visit (INDEPENDENT_AMBULATORY_CARE_PROVIDER_SITE_OTHER): Payer: Self-pay | Admitting: Otolaryngology

## 2016-04-27 DIAGNOSIS — J3489 Other specified disorders of nose and nasal sinuses: Secondary | ICD-10-CM | POA: Diagnosis not present

## 2016-04-27 DIAGNOSIS — R51 Headache: Secondary | ICD-10-CM | POA: Diagnosis not present

## 2016-04-27 DIAGNOSIS — B349 Viral infection, unspecified: Secondary | ICD-10-CM | POA: Diagnosis not present

## 2016-04-27 DIAGNOSIS — J019 Acute sinusitis, unspecified: Secondary | ICD-10-CM | POA: Diagnosis not present

## 2016-04-27 DIAGNOSIS — Z1389 Encounter for screening for other disorder: Secondary | ICD-10-CM | POA: Diagnosis not present

## 2016-05-02 IMAGING — CT CT ABD-PELV W/ CM
2 of 4 series · 10 of 46 positions shown, 11 images · IV contrast (Iodine)
Comparison: 09/10/2013

CLINICAL DATA: Abdominal pain with change in bowel habits for 3
weeks. Constipation.

EXAM:
CT ABDOMEN AND PELVIS WITH CONTRAST
TECHNIQUE: Multidetector CT imaging of the abdomen and pelvis was performed
using the standard protocol following bolus administration of
intravenous contrast.
CONTRAST:  533cc BZMJYR-4AA IOPAMIDOL (BZMJYR-4AA) INJECTION 61%

[Series 201: routine, idose (2) · axial · 0.81mm/px · z∈[+59,+509]mm · 7 of 108 slices shown, 8 images]
[im 9/108  soft-tissue]
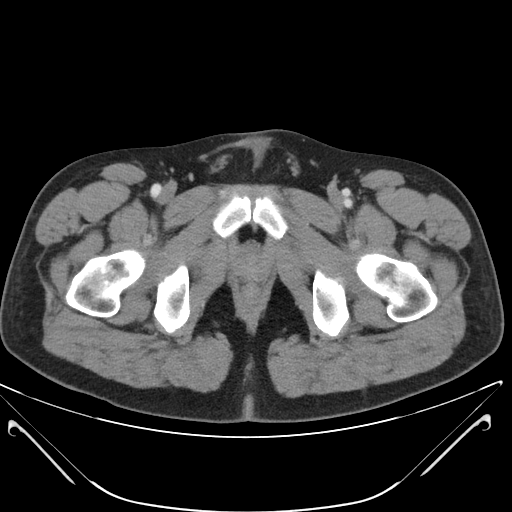
[im 9/108  bone]
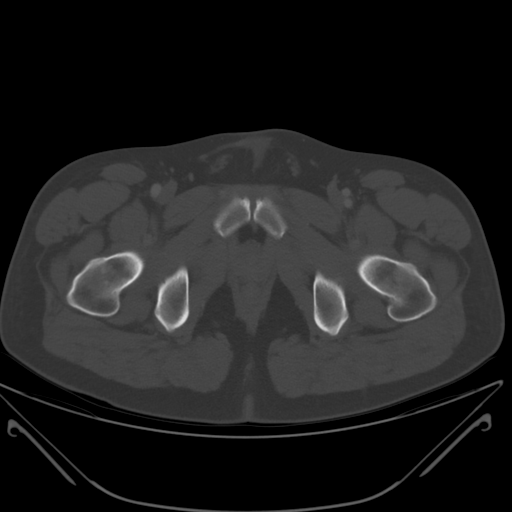
[im 23/108  soft-tissue]
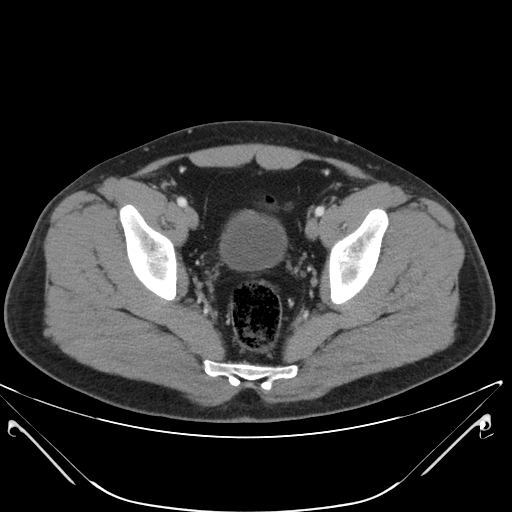
[im 41/108  soft-tissue]
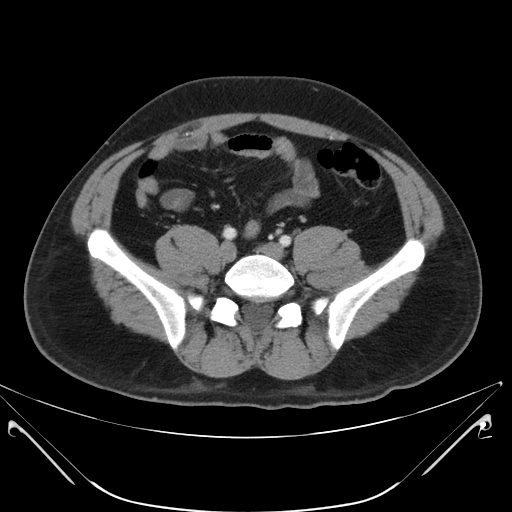
[im 54/108  soft-tissue]
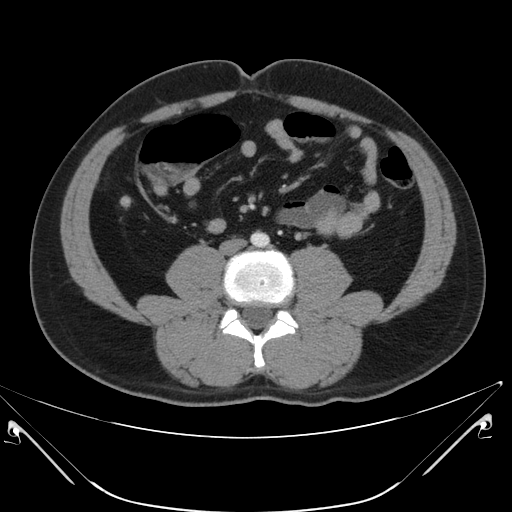
[im 67/108  soft-tissue]
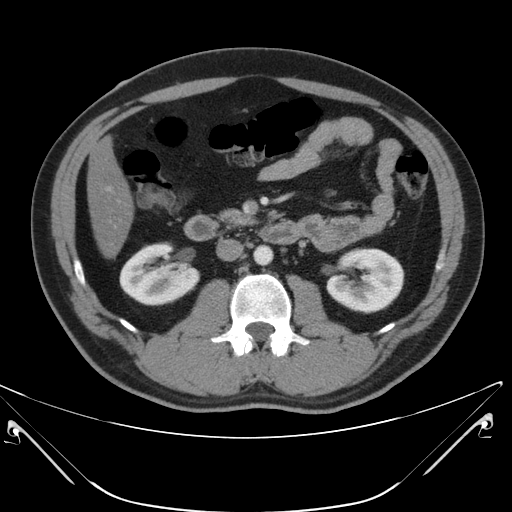
[im 85/108  soft-tissue]
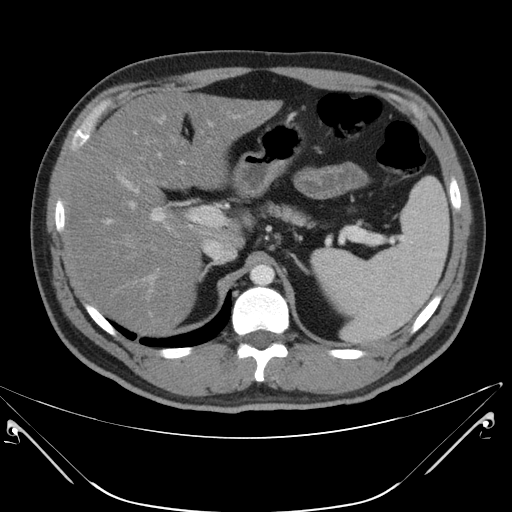
[im 99/108  soft-tissue]
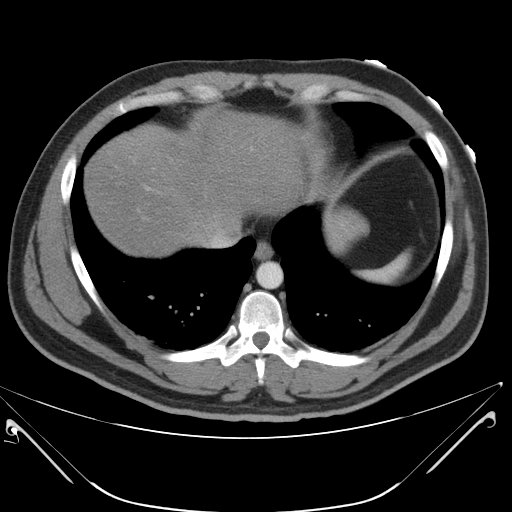

[Series 203: coronals, idose (2) · coronal · 0.45mm/px · 3 of 121 slices shown]
[im 41/121  soft-tissue]
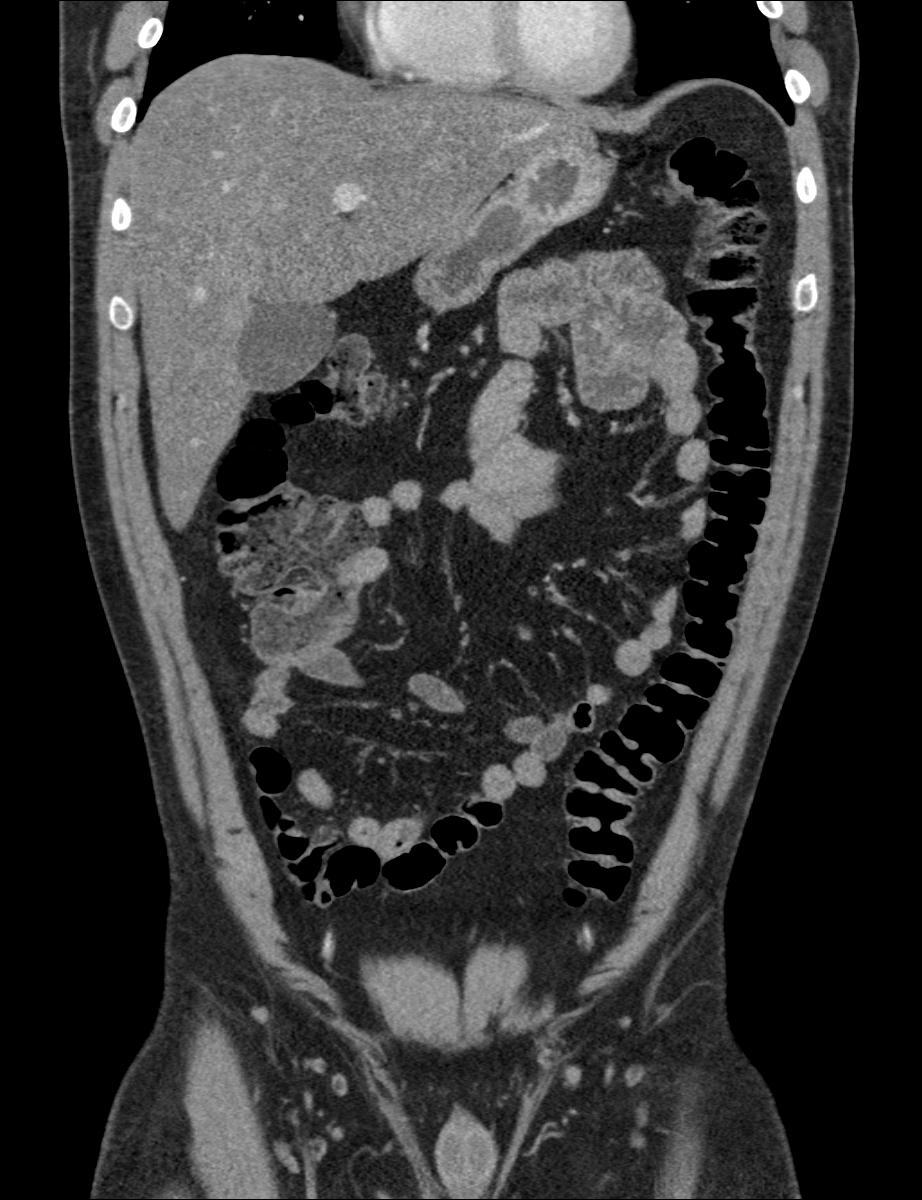
[im 54/121  soft-tissue]
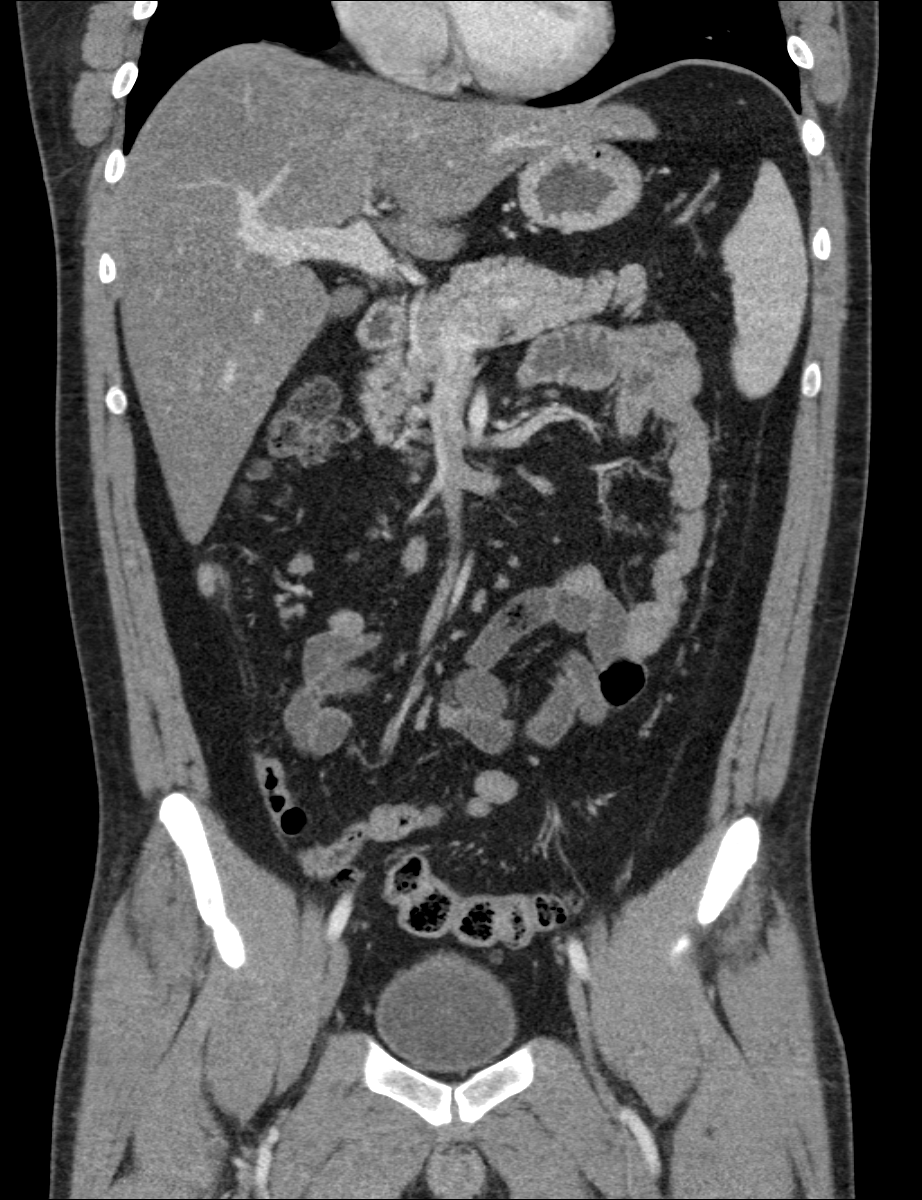
[im 67/121  soft-tissue]
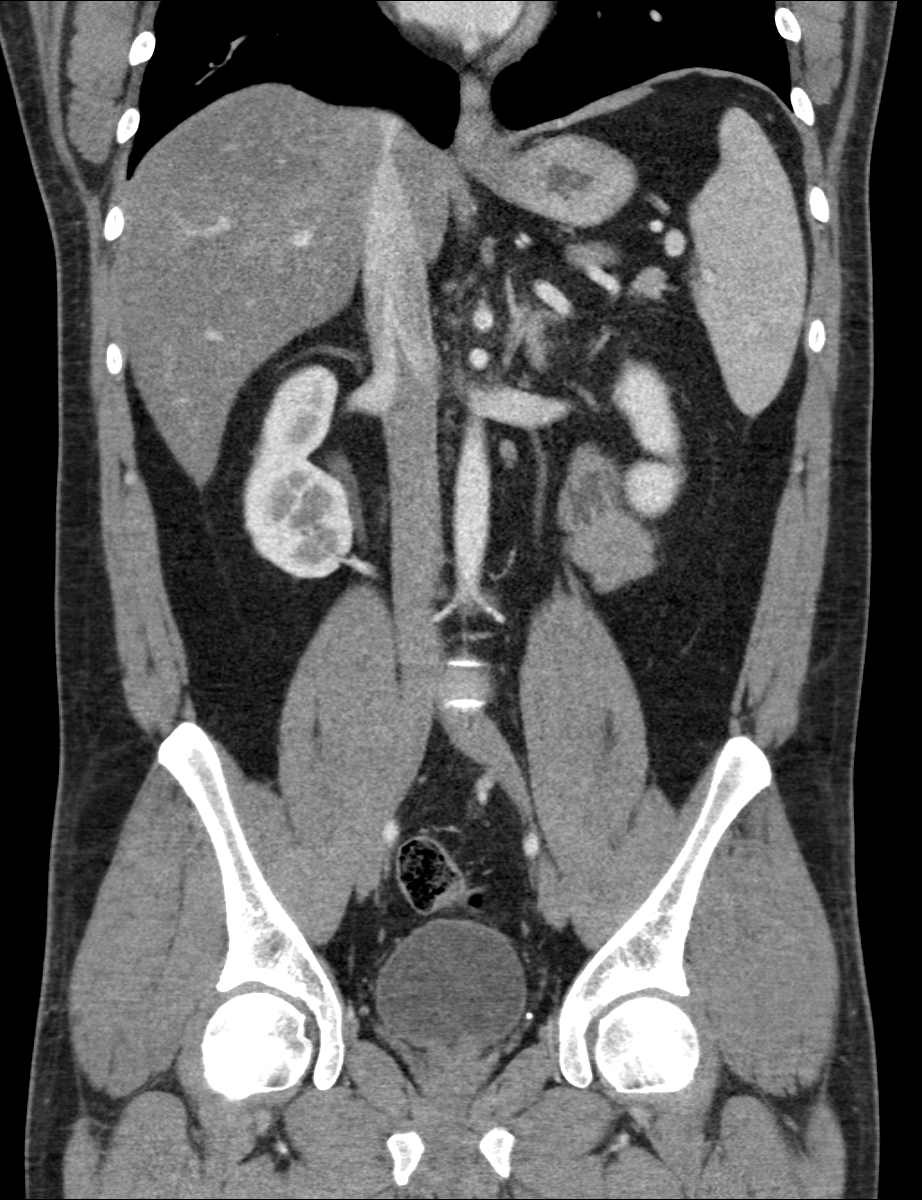

[10 of 46 positions shown; findings below may reference images not displayed]

FINDINGS: Lower chest and abdominal wall: 4 mm nodule in the left lower lobe,
considered incidental based on size and patient age.

Hepatobiliary: Hepatic steatosis without focal lesion.No evidence of
biliary obstruction or stone.

Pancreas: Unremarkable.

Spleen: Unremarkable.

Adrenals/Urinary Tract: Negative adrenals. No hydronephrosis or
stone. Unremarkable bladder.

Reproductive:No pathologic findings.

Stomach/Bowel:  No obstruction. No appendicitis.

Vascular/Lymphatic: No acute vascular abnormality. Incidental low
accessory right renal artery.

No mass or adenopathy.

Peritoneal: No ascites or pneumoperitoneum.

Musculoskeletal: No acute abnormalities.
IMPRESSION: 1. No acute finding or explanation for symptoms. No abnormal stool
retention to correlate with constipation history.
2. Hepatic steatosis.

## 2016-09-27 DIAGNOSIS — R1012 Left upper quadrant pain: Secondary | ICD-10-CM | POA: Diagnosis not present

## 2016-09-27 DIAGNOSIS — M545 Low back pain: Secondary | ICD-10-CM | POA: Diagnosis not present

## 2016-09-27 DIAGNOSIS — R109 Unspecified abdominal pain: Secondary | ICD-10-CM | POA: Diagnosis not present

## 2016-09-27 DIAGNOSIS — K5900 Constipation, unspecified: Secondary | ICD-10-CM | POA: Diagnosis not present

## 2017-04-04 DIAGNOSIS — Z0000 Encounter for general adult medical examination without abnormal findings: Secondary | ICD-10-CM | POA: Diagnosis not present

## 2017-04-04 DIAGNOSIS — M2550 Pain in unspecified joint: Secondary | ICD-10-CM | POA: Diagnosis not present

## 2017-04-04 DIAGNOSIS — Z1389 Encounter for screening for other disorder: Secondary | ICD-10-CM | POA: Diagnosis not present

## 2017-04-04 DIAGNOSIS — L700 Acne vulgaris: Secondary | ICD-10-CM | POA: Diagnosis not present

## 2017-04-04 DIAGNOSIS — Z0001 Encounter for general adult medical examination with abnormal findings: Secondary | ICD-10-CM | POA: Diagnosis not present

## 2017-08-24 DIAGNOSIS — G4730 Sleep apnea, unspecified: Secondary | ICD-10-CM | POA: Diagnosis not present

## 2017-09-28 DIAGNOSIS — G4730 Sleep apnea, unspecified: Secondary | ICD-10-CM | POA: Diagnosis not present

## 2017-10-21 DIAGNOSIS — J301 Allergic rhinitis due to pollen: Secondary | ICD-10-CM | POA: Diagnosis not present

## 2017-10-21 DIAGNOSIS — Z1389 Encounter for screening for other disorder: Secondary | ICD-10-CM | POA: Diagnosis not present

## 2017-11-17 DIAGNOSIS — G4730 Sleep apnea, unspecified: Secondary | ICD-10-CM | POA: Diagnosis not present

## 2017-11-24 DIAGNOSIS — D751 Secondary polycythemia: Secondary | ICD-10-CM | POA: Diagnosis not present

## 2017-11-24 DIAGNOSIS — G609 Hereditary and idiopathic neuropathy, unspecified: Secondary | ICD-10-CM | POA: Diagnosis not present

## 2017-11-24 DIAGNOSIS — Z1389 Encounter for screening for other disorder: Secondary | ICD-10-CM | POA: Diagnosis not present

## 2017-11-24 DIAGNOSIS — R631 Polydipsia: Secondary | ICD-10-CM | POA: Diagnosis not present

## 2017-12-12 DIAGNOSIS — H6692 Otitis media, unspecified, left ear: Secondary | ICD-10-CM | POA: Diagnosis not present

## 2017-12-12 DIAGNOSIS — J069 Acute upper respiratory infection, unspecified: Secondary | ICD-10-CM | POA: Diagnosis not present

## 2017-12-12 DIAGNOSIS — J343 Hypertrophy of nasal turbinates: Secondary | ICD-10-CM | POA: Diagnosis not present

## 2017-12-12 DIAGNOSIS — J029 Acute pharyngitis, unspecified: Secondary | ICD-10-CM | POA: Diagnosis not present

## 2018-02-06 DIAGNOSIS — E6609 Other obesity due to excess calories: Secondary | ICD-10-CM | POA: Diagnosis not present

## 2018-02-06 DIAGNOSIS — R6889 Other general symptoms and signs: Secondary | ICD-10-CM | POA: Diagnosis not present

## 2018-02-06 DIAGNOSIS — B349 Viral infection, unspecified: Secondary | ICD-10-CM | POA: Diagnosis not present

## 2018-02-06 DIAGNOSIS — J111 Influenza due to unidentified influenza virus with other respiratory manifestations: Secondary | ICD-10-CM | POA: Diagnosis not present

## 2018-03-28 DIAGNOSIS — B355 Tinea imbricata: Secondary | ICD-10-CM | POA: Diagnosis not present

## 2018-03-28 DIAGNOSIS — Z6829 Body mass index (BMI) 29.0-29.9, adult: Secondary | ICD-10-CM | POA: Diagnosis not present

## 2018-03-28 DIAGNOSIS — E663 Overweight: Secondary | ICD-10-CM | POA: Diagnosis not present

## 2018-04-21 DIAGNOSIS — R300 Dysuria: Secondary | ICD-10-CM | POA: Diagnosis not present

## 2018-04-21 DIAGNOSIS — K5900 Constipation, unspecified: Secondary | ICD-10-CM | POA: Diagnosis not present

## 2018-04-21 DIAGNOSIS — N411 Chronic prostatitis: Secondary | ICD-10-CM | POA: Diagnosis not present

## 2018-04-21 DIAGNOSIS — K600 Acute anal fissure: Secondary | ICD-10-CM | POA: Diagnosis not present

## 2020-07-25 ENCOUNTER — Other Ambulatory Visit (HOSPITAL_COMMUNITY): Payer: Self-pay | Admitting: Family Medicine

## 2020-07-25 ENCOUNTER — Other Ambulatory Visit: Payer: Self-pay

## 2020-07-25 ENCOUNTER — Ambulatory Visit (HOSPITAL_COMMUNITY)
Admission: RE | Admit: 2020-07-25 | Discharge: 2020-07-25 | Disposition: A | Discharge status: Home / Self Care | Payer: 59 | Source: Ambulatory Visit | Attending: Family Medicine | Admitting: Family Medicine

## 2020-07-25 DIAGNOSIS — M79671 Pain in right foot: Secondary | ICD-10-CM

## 2021-07-20 IMAGING — DX DG FOOT COMPLETE 3+V*R*
3 series · 3 of 3 positions shown · non-contrast
Comparison: None.

CLINICAL DATA: Right foot pain, swelling 2 weeks

EXAM:
RIGHT FOOT COMPLETE - 3+ VIEW

[foot ap]
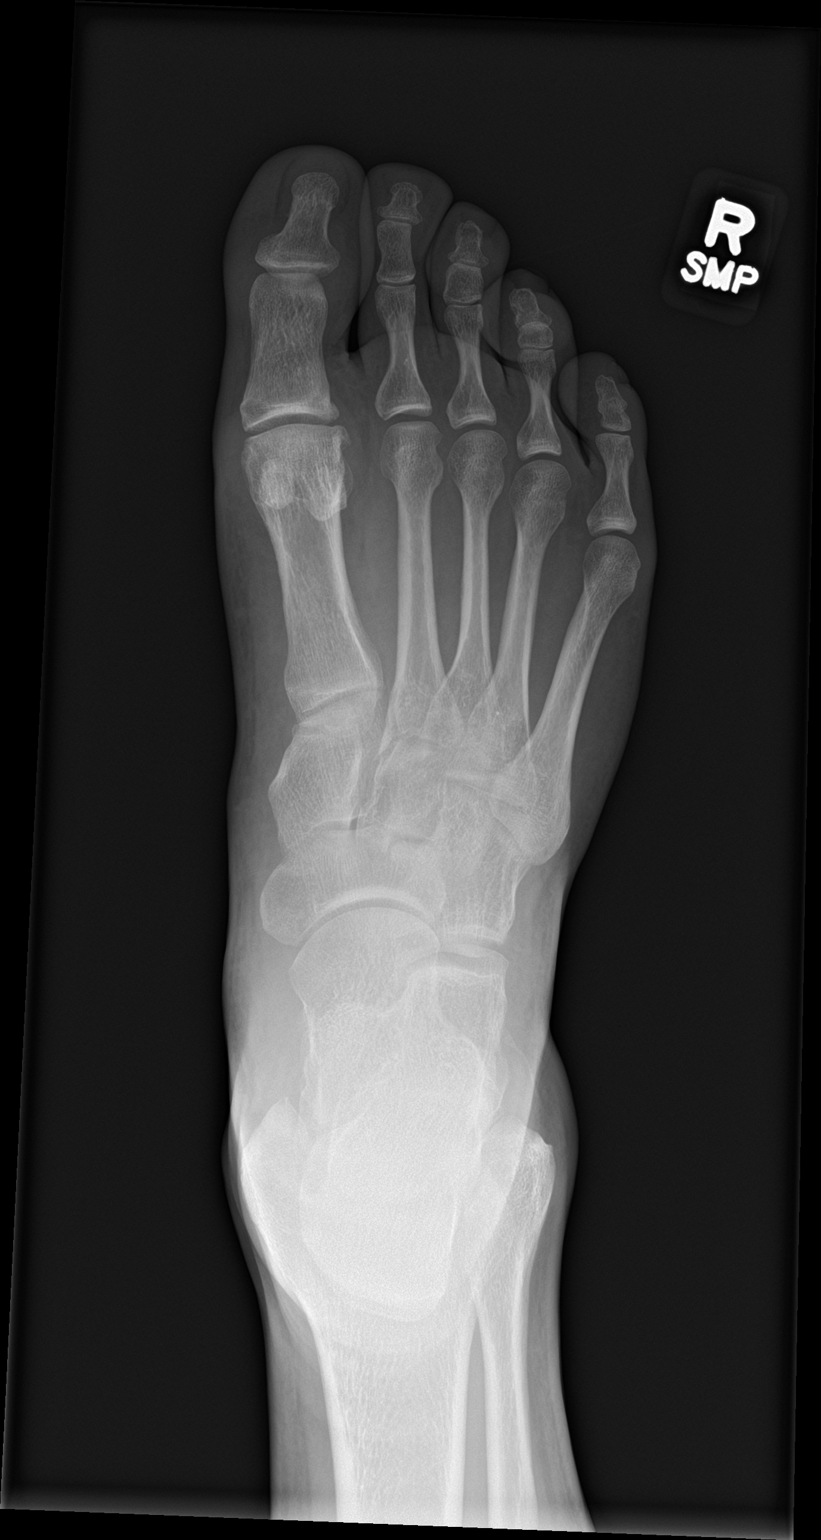

[foot obl]
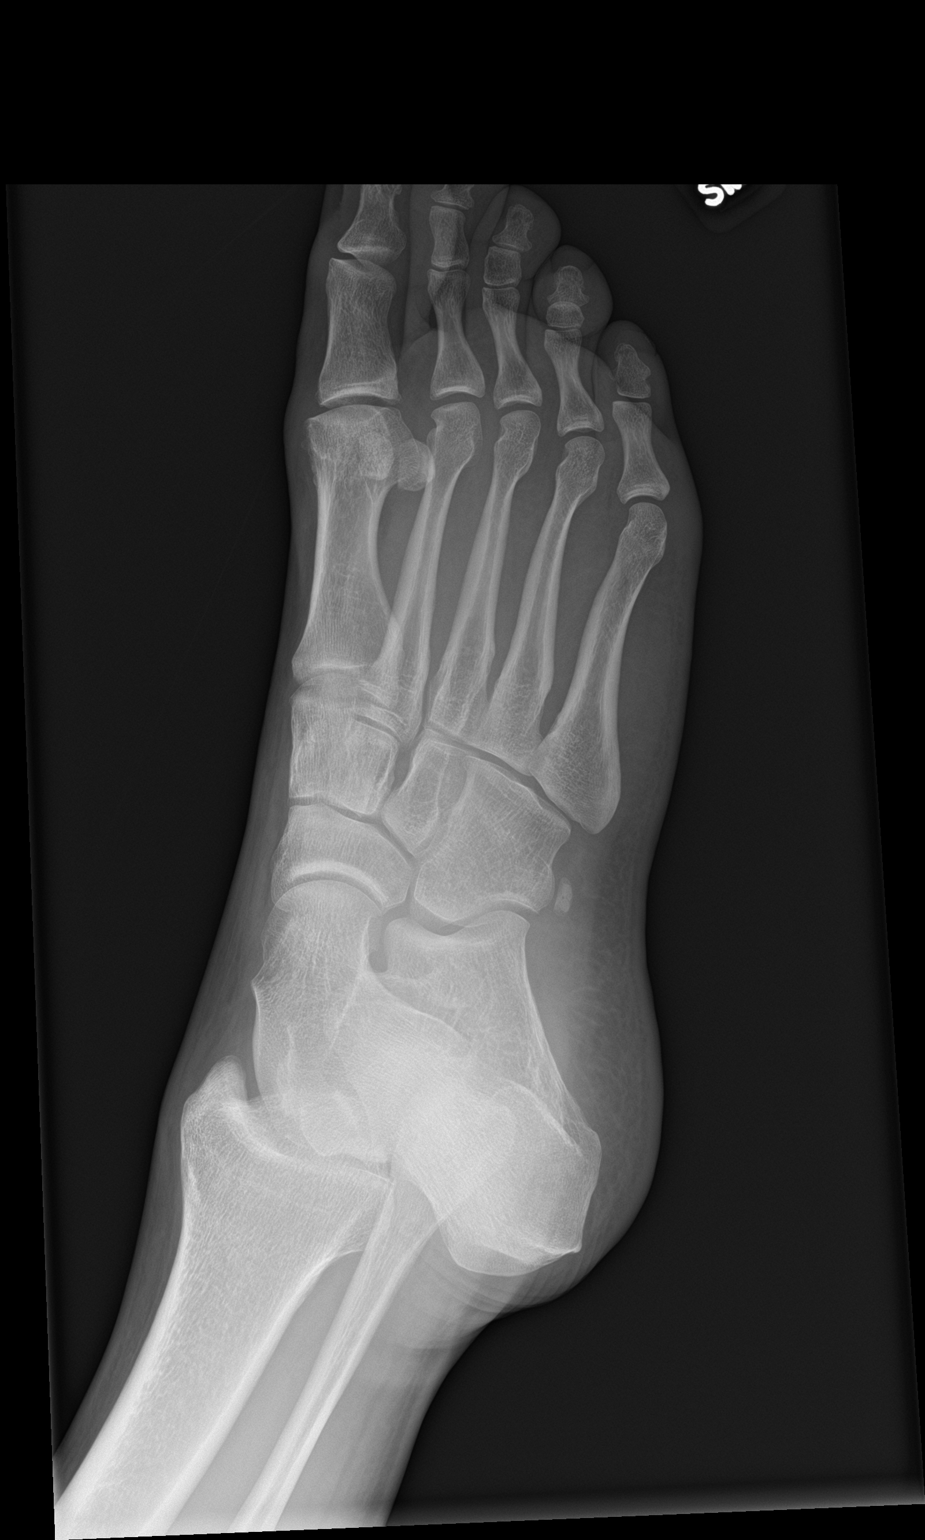

[foot lat]
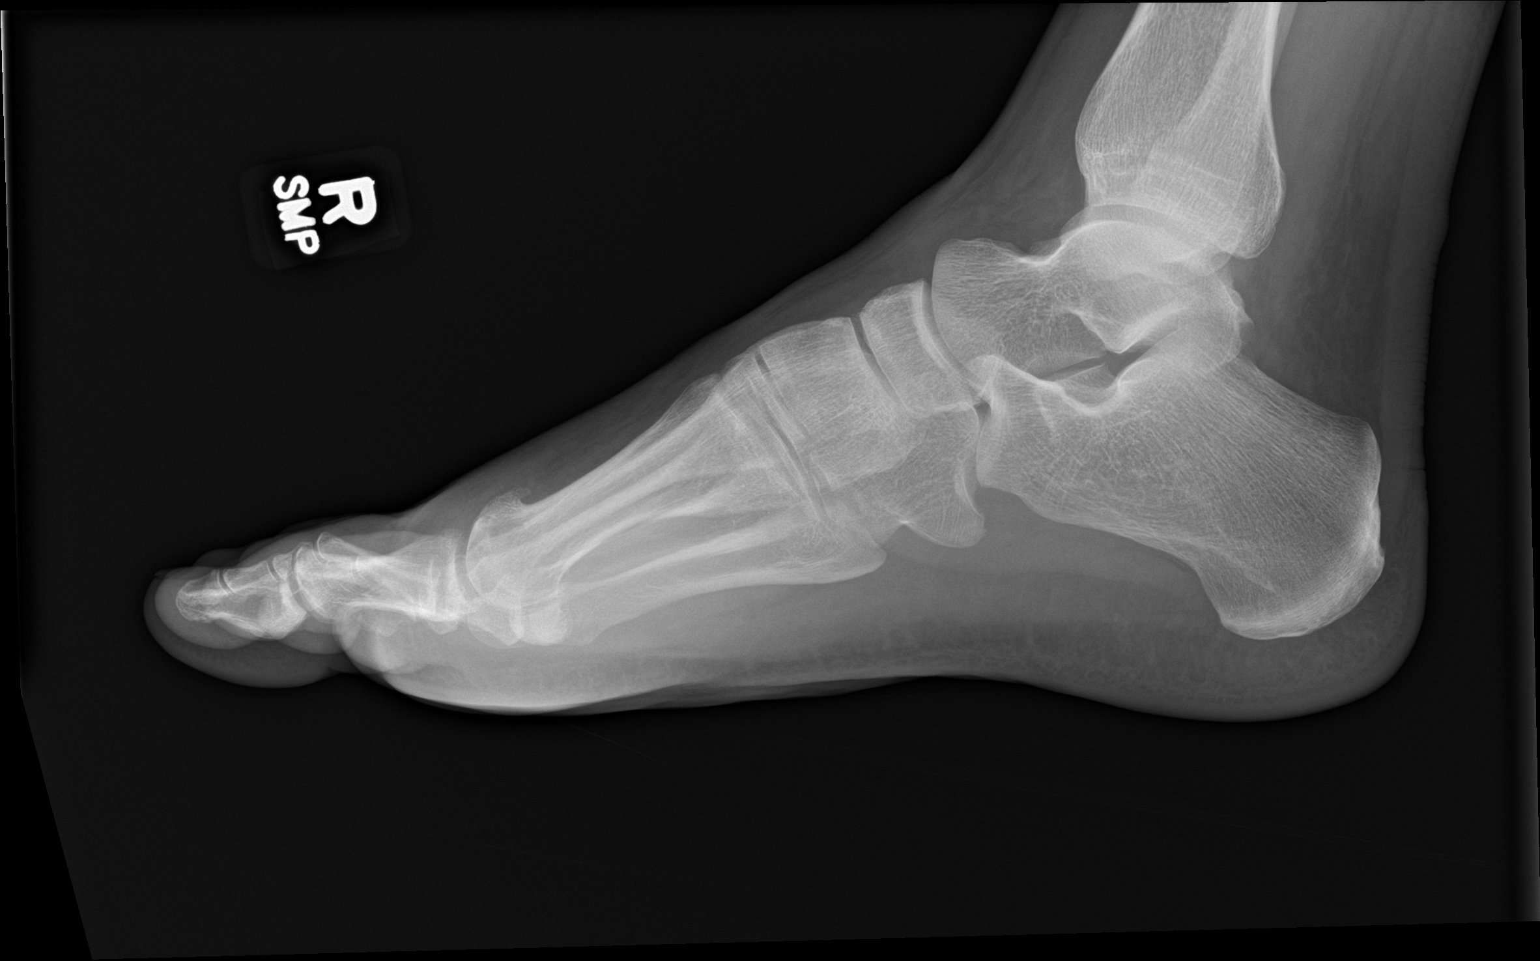

[3 of 3 positions shown; findings below may reference images not displayed]

FINDINGS: Minimal soft tissue swelling. No acute fracture or traumatic
malalignment within limitations of a nonweightbearing exam. Mild
degenerative changes in the foot most pronounced at the first
metatarsophalangeal joint with periarticular spurring. Corticated os
peroneum noted. No acute osseous abnormality or suspicious osseous
lesion. Normal bone mineralization. No sizable ankle effusion.
IMPRESSION: Minimal soft tissue swelling.

No acute fracture or suspicious osseous abnormality.

Mild arthrosis of the first metatarsophalangeal joint.

## 2022-03-01 ENCOUNTER — Other Ambulatory Visit (HOSPITAL_COMMUNITY): Payer: Self-pay | Admitting: Internal Medicine

## 2022-03-01 ENCOUNTER — Ambulatory Visit (HOSPITAL_COMMUNITY)
Admission: RE | Admit: 2022-03-01 | Discharge: 2022-03-01 | Disposition: A | Discharge status: Home / Self Care | Payer: Managed Care, Other (non HMO) | Source: Ambulatory Visit | Attending: Internal Medicine | Admitting: Internal Medicine

## 2022-03-01 DIAGNOSIS — R109 Unspecified abdominal pain: Secondary | ICD-10-CM | POA: Diagnosis present

## 2022-03-01 DIAGNOSIS — K5903 Drug induced constipation: Secondary | ICD-10-CM

## 2022-03-01 DIAGNOSIS — T402X5A Adverse effect of other opioids, initial encounter: Secondary | ICD-10-CM | POA: Diagnosis present

## 2022-04-19 ENCOUNTER — Other Ambulatory Visit (HOSPITAL_COMMUNITY): Payer: Self-pay | Admitting: Family Medicine

## 2022-04-19 ENCOUNTER — Ambulatory Visit (HOSPITAL_COMMUNITY)
Admission: RE | Admit: 2022-04-19 | Discharge: 2022-04-19 | Disposition: A | Discharge status: Home / Self Care | Payer: Managed Care, Other (non HMO) | Source: Ambulatory Visit | Attending: Family Medicine | Admitting: Family Medicine

## 2022-04-19 DIAGNOSIS — S8992XA Unspecified injury of left lower leg, initial encounter: Secondary | ICD-10-CM
